# Patient Record
Sex: Female | Born: 1970
Health system: Southern US, Community
[De-identification: ages and names within clinical notes are randomized; demographics above are authoritative.]

## PROBLEM LIST (undated history)

## (undated) DIAGNOSIS — S82892A Other fracture of left lower leg, initial encounter for closed fracture: Secondary | ICD-10-CM

## (undated) DIAGNOSIS — F17200 Nicotine dependence, unspecified, uncomplicated: Secondary | ICD-10-CM

## (undated) DIAGNOSIS — F419 Anxiety disorder, unspecified: Secondary | ICD-10-CM

## (undated) DIAGNOSIS — C439 Malignant melanoma of skin, unspecified: Secondary | ICD-10-CM

## (undated) DIAGNOSIS — N6019 Diffuse cystic mastopathy of unspecified breast: Secondary | ICD-10-CM

## (undated) DIAGNOSIS — R002 Palpitations: Secondary | ICD-10-CM

## (undated) DIAGNOSIS — J301 Allergic rhinitis due to pollen: Secondary | ICD-10-CM

## (undated) DIAGNOSIS — G43909 Migraine, unspecified, not intractable, without status migrainosus: Secondary | ICD-10-CM

## (undated) DIAGNOSIS — Z8659 Personal history of other mental and behavioral disorders: Secondary | ICD-10-CM

## (undated) DIAGNOSIS — N6320 Unspecified lump in the left breast, unspecified quadrant: Secondary | ICD-10-CM

## (undated) DIAGNOSIS — D649 Anemia, unspecified: Secondary | ICD-10-CM

## (undated) DIAGNOSIS — E785 Hyperlipidemia, unspecified: Secondary | ICD-10-CM

## (undated) HISTORY — DX: Other fracture of left lower leg, initial encounter for closed fracture: S82.892A

## (undated) HISTORY — DX: Anxiety disorder, unspecified: F41.9

## (undated) HISTORY — DX: Nicotine dependence, unspecified, uncomplicated: F17.200

## (undated) HISTORY — DX: Allergic rhinitis due to pollen: J30.1

## (undated) HISTORY — DX: Palpitations: R00.2

## (undated) HISTORY — DX: Diffuse cystic mastopathy of unspecified breast: N60.19

## (undated) HISTORY — DX: Personal history of other mental and behavioral disorders: Z86.59

## (undated) HISTORY — DX: Malignant melanoma of skin, unspecified: C43.9

## (undated) HISTORY — DX: Migraine, unspecified, not intractable, without status migrainosus: G43.909

## (undated) HISTORY — DX: Hyperlipidemia, unspecified: E78.5

## (undated) HISTORY — PX: MOLE REMOVAL: SHX2046

## (undated) HISTORY — PX: BLADDER SURGERY: SHX569

## (undated) HISTORY — DX: Anemia, unspecified: D64.9

---

## 1997-10-20 ENCOUNTER — Ambulatory Visit (HOSPITAL_COMMUNITY): Admission: RE | Admit: 1997-10-20 | Discharge: 1997-10-20 | Payer: Self-pay | Admitting: *Deleted

## 1997-11-10 ENCOUNTER — Ambulatory Visit (HOSPITAL_COMMUNITY): Admission: RE | Admit: 1997-11-10 | Discharge: 1997-11-10 | Payer: Self-pay | Admitting: *Deleted

## 1997-12-17 ENCOUNTER — Ambulatory Visit (HOSPITAL_COMMUNITY): Admission: RE | Admit: 1997-12-17 | Discharge: 1997-12-17 | Payer: Self-pay | Admitting: *Deleted

## 1998-02-05 ENCOUNTER — Inpatient Hospital Stay (HOSPITAL_COMMUNITY): Admission: AD | Admit: 1998-02-05 | Discharge: 1998-02-07 | Payer: Self-pay | Admitting: *Deleted

## 1998-02-07 ENCOUNTER — Encounter (HOSPITAL_COMMUNITY): Admission: RE | Admit: 1998-02-07 | Discharge: 1998-05-08 | Payer: Self-pay | Admitting: *Deleted

## 1998-05-27 ENCOUNTER — Encounter (HOSPITAL_COMMUNITY): Admission: RE | Admit: 1998-05-27 | Discharge: 1998-08-25 | Payer: Self-pay | Admitting: *Deleted

## 1998-09-04 ENCOUNTER — Emergency Department (HOSPITAL_COMMUNITY): Admission: EM | Admit: 1998-09-04 | Discharge: 1998-09-04 | Payer: Self-pay | Admitting: Emergency Medicine

## 1998-09-04 ENCOUNTER — Encounter: Payer: Self-pay | Admitting: Emergency Medicine

## 1998-09-25 ENCOUNTER — Emergency Department (HOSPITAL_COMMUNITY): Admission: EM | Admit: 1998-09-25 | Discharge: 1998-09-25 | Payer: Self-pay | Admitting: Emergency Medicine

## 2001-01-29 ENCOUNTER — Other Ambulatory Visit: Admission: RE | Admit: 2001-01-29 | Discharge: 2001-01-29 | Payer: Self-pay | Admitting: *Deleted

## 2002-02-24 ENCOUNTER — Other Ambulatory Visit: Admission: RE | Admit: 2002-02-24 | Discharge: 2002-02-24 | Payer: Self-pay | Admitting: Obstetrics and Gynecology

## 2003-02-27 ENCOUNTER — Other Ambulatory Visit: Admission: RE | Admit: 2003-02-27 | Discharge: 2003-02-27 | Payer: Self-pay | Admitting: Obstetrics and Gynecology

## 2003-06-13 ENCOUNTER — Emergency Department (HOSPITAL_COMMUNITY): Admission: EM | Admit: 2003-06-13 | Discharge: 2003-06-13 | Payer: Self-pay | Admitting: Emergency Medicine

## 2004-10-20 ENCOUNTER — Encounter: Admission: RE | Admit: 2004-10-20 | Discharge: 2004-10-20 | Payer: Self-pay | Admitting: Cardiology

## 2004-11-07 ENCOUNTER — Encounter: Admission: RE | Admit: 2004-11-07 | Discharge: 2004-11-07 | Payer: Self-pay | Admitting: Orthopedic Surgery

## 2004-12-08 ENCOUNTER — Other Ambulatory Visit: Admission: RE | Admit: 2004-12-08 | Discharge: 2004-12-08 | Payer: Self-pay | Admitting: Obstetrics and Gynecology

## 2005-04-29 ENCOUNTER — Emergency Department (HOSPITAL_COMMUNITY): Admission: EM | Admit: 2005-04-29 | Discharge: 2005-04-30 | Payer: Self-pay | Admitting: Emergency Medicine

## 2005-05-05 ENCOUNTER — Encounter: Admission: RE | Admit: 2005-05-05 | Discharge: 2005-05-05 | Payer: Self-pay | Admitting: Gastroenterology

## 2005-05-09 ENCOUNTER — Ambulatory Visit (HOSPITAL_COMMUNITY): Admission: RE | Admit: 2005-05-09 | Discharge: 2005-05-09 | Payer: Self-pay | Admitting: Gastroenterology

## 2005-06-01 ENCOUNTER — Ambulatory Visit (HOSPITAL_COMMUNITY): Admission: RE | Admit: 2005-06-01 | Discharge: 2005-06-01 | Payer: Self-pay | Admitting: Gastroenterology

## 2005-06-03 ENCOUNTER — Emergency Department (HOSPITAL_COMMUNITY): Admission: EM | Admit: 2005-06-03 | Discharge: 2005-06-03 | Payer: Self-pay | Admitting: Emergency Medicine

## 2005-06-21 ENCOUNTER — Ambulatory Visit (HOSPITAL_COMMUNITY): Admission: RE | Admit: 2005-06-21 | Discharge: 2005-06-21 | Payer: Self-pay | Admitting: Gastroenterology

## 2006-01-16 ENCOUNTER — Other Ambulatory Visit: Admission: RE | Admit: 2006-01-16 | Discharge: 2006-01-16 | Payer: Self-pay | Admitting: Obstetrics and Gynecology

## 2010-08-27 ENCOUNTER — Encounter: Payer: Self-pay | Admitting: Gastroenterology

## 2011-12-06 ENCOUNTER — Other Ambulatory Visit: Payer: Self-pay | Admitting: Obstetrics and Gynecology

## 2011-12-06 DIAGNOSIS — Z1231 Encounter for screening mammogram for malignant neoplasm of breast: Secondary | ICD-10-CM

## 2012-02-07 ENCOUNTER — Ambulatory Visit
Admission: RE | Admit: 2012-02-07 | Discharge: 2012-02-07 | Disposition: A | Discharge status: Home / Self Care | Payer: BC Managed Care – PPO | Source: Ambulatory Visit | Attending: Obstetrics and Gynecology | Admitting: Obstetrics and Gynecology

## 2012-02-07 DIAGNOSIS — Z1231 Encounter for screening mammogram for malignant neoplasm of breast: Secondary | ICD-10-CM

## 2012-02-13 ENCOUNTER — Other Ambulatory Visit: Payer: Self-pay | Admitting: Obstetrics and Gynecology

## 2012-02-13 DIAGNOSIS — R928 Other abnormal and inconclusive findings on diagnostic imaging of breast: Secondary | ICD-10-CM

## 2012-02-28 ENCOUNTER — Ambulatory Visit
Admission: RE | Admit: 2012-02-28 | Discharge: 2012-02-28 | Disposition: A | Discharge status: Home / Self Care | Payer: BC Managed Care – PPO | Source: Ambulatory Visit | Attending: Obstetrics and Gynecology | Admitting: Obstetrics and Gynecology

## 2012-02-28 DIAGNOSIS — R928 Other abnormal and inconclusive findings on diagnostic imaging of breast: Secondary | ICD-10-CM

## 2012-11-01 ENCOUNTER — Encounter: Payer: Self-pay | Admitting: Family Medicine

## 2012-11-01 ENCOUNTER — Ambulatory Visit (INDEPENDENT_AMBULATORY_CARE_PROVIDER_SITE_OTHER): Payer: BC Managed Care – PPO | Admitting: Family Medicine

## 2012-11-01 VITALS — BP 119/73 | HR 83 | Temp 99.4°F | Resp 14 | Ht 60.5 in | Wt 95.5 lb

## 2012-11-01 DIAGNOSIS — Z0000 Encounter for general adult medical examination without abnormal findings: Secondary | ICD-10-CM | POA: Insufficient documentation
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
## 2012-11-02 ENCOUNTER — Encounter: Payer: Self-pay | Admitting: Family Medicine

## 2012-11-02 NOTE — Assessment & Plan Note (Addendum)
Reviewed age and gender appropriate health maintenance issues (prudent diet, regular exercise, health risks of tobacco and excessive alcohol, use of seatbelts, fire alarms in home, use of sunscreen).  Also reviewed age and gender appropriate health screening as well as vaccine recommendations. No vaccines due (last Tdap 2 yrs ago per her report).  Smoking cessation encouraged/discussed: pt not contemplating quitting at this time. She'll get paps/mammos via GYN. I am fine with continuing her on her xanax as currently prescribed if she wants me to be the person to RF this in the future (currently this rx is through Dr. Sharyn Lull, her cardiologist).  If not, then I'll see her again for routine CPE in 1 yr.

## 2013-02-10 ENCOUNTER — Other Ambulatory Visit: Payer: Self-pay | Admitting: Obstetrics and Gynecology

## 2013-02-10 DIAGNOSIS — Z1231 Encounter for screening mammogram for malignant neoplasm of breast: Secondary | ICD-10-CM

## 2013-02-28 ENCOUNTER — Ambulatory Visit: Payer: BC Managed Care – PPO

## 2013-03-06 ENCOUNTER — Ambulatory Visit
Admission: RE | Admit: 2013-03-06 | Discharge: 2013-03-06 | Disposition: A | Discharge status: Home / Self Care | Payer: BC Managed Care – PPO | Source: Ambulatory Visit | Attending: Obstetrics and Gynecology | Admitting: Obstetrics and Gynecology

## 2013-03-06 DIAGNOSIS — Z1231 Encounter for screening mammogram for malignant neoplasm of breast: Secondary | ICD-10-CM

## 2013-03-18 DIAGNOSIS — D229 Melanocytic nevi, unspecified: Secondary | ICD-10-CM

## 2013-03-18 HISTORY — DX: Melanocytic nevi, unspecified: D22.9

## 2013-11-04 ENCOUNTER — Telehealth: Payer: Self-pay | Admitting: Family Medicine

## 2013-11-04 ENCOUNTER — Encounter: Payer: Self-pay | Admitting: Family Medicine

## 2013-11-04 ENCOUNTER — Ambulatory Visit (INDEPENDENT_AMBULATORY_CARE_PROVIDER_SITE_OTHER): Payer: BC Managed Care – PPO | Admitting: Family Medicine

## 2013-11-04 VITALS — BP 109/72 | HR 67 | Temp 99.0°F | Resp 16 | Ht 60.5 in | Wt 110.0 lb

## 2013-11-04 DIAGNOSIS — Z0000 Encounter for general adult medical examination without abnormal findings: Secondary | ICD-10-CM

## 2013-11-04 LAB — LIPID PANEL
CHOLESTEROL: 195 mg/dL (ref 0–200)
HDL: 56.9 mg/dL (ref >39.00)
LDL CALC: 128 mg/dL — AB (ref 0–99)
TRIGLYCERIDES: 53 mg/dL (ref 0.0–149.0)
Total CHOL/HDL Ratio: 3
VLDL: 10.6 mg/dL (ref 0.0–40.0)

## 2013-11-04 LAB — COMPREHENSIVE METABOLIC PANEL
ALBUMIN: 4.2 g/dL (ref 3.5–5.2)
ALT: 13 U/L (ref 0–35)
AST: 19 U/L (ref 0–37)
Alkaline Phosphatase: 54 U/L (ref 39–117)
BUN: 8 mg/dL (ref 6–23)
CALCIUM: 8.8 mg/dL (ref 8.4–10.5)
CO2: 25 meq/L (ref 19–32)
Chloride: 105 mEq/L (ref 96–112)
Creatinine, Ser: 0.8 mg/dL (ref 0.4–1.2)
GFR: 89.89 mL/min (ref >60.00)
Glucose, Bld: 93 mg/dL (ref 70–99)
Potassium: 4 mEq/L (ref 3.5–5.1)
Sodium: 139 mEq/L (ref 135–145)
TOTAL PROTEIN: 7 g/dL (ref 6.0–8.3)
Total Bilirubin: 0.6 mg/dL (ref 0.3–1.2)

## 2013-11-04 LAB — CBC WITH DIFFERENTIAL/PLATELET
BASOS PCT: 0.7 % (ref 0.0–3.0)
Basophils Absolute: 0.1 10*3/uL (ref 0.0–0.1)
Eosinophils Absolute: 0.4 10*3/uL (ref 0.0–0.7)
Eosinophils Relative: 5.4 % — ABNORMAL HIGH (ref 0.0–5.0)
HEMATOCRIT: 40.1 % (ref 36.0–46.0)
Hemoglobin: 13.3 g/dL (ref 12.0–15.0)
LYMPHS PCT: 22.8 % (ref 12.0–46.0)
Lymphs Abs: 1.9 10*3/uL (ref 0.7–4.0)
MCHC: 33.2 g/dL (ref 30.0–36.0)
MCV: 94.2 fl (ref 78.0–100.0)
MONOS PCT: 7.4 % (ref 3.0–12.0)
Monocytes Absolute: 0.6 10*3/uL (ref 0.1–1.0)
NEUTROS ABS: 5.2 10*3/uL (ref 1.4–7.7)
NEUTROS PCT: 63.7 % (ref 43.0–77.0)
PLATELETS: 239 10*3/uL (ref 150.0–400.0)
RBC: 4.26 Mil/uL (ref 3.87–5.11)
RDW: 14.6 % (ref 11.5–14.6)
WBC: 8.1 10*3/uL (ref 4.5–10.5)

## 2013-11-04 LAB — TSH: TSH: 0.48 u[IU]/mL (ref 0.35–5.50)

## 2013-11-04 NOTE — Assessment & Plan Note (Signed)
Reviewed age and gender appropriate health maintenance issues (prudent diet, regular exercise, health risks of tobacco and excessive alcohol, use of seatbelts, fire alarms in home, use of sunscreen).  Also reviewed age and gender appropriate health screening as well as vaccine recommendations. HP labs today--will forward copy to her cardiologist, Dr. Terrence Dupont.  Encouraged smoking cessation and START EXERCISING REGULARLY. GYN health maintenance is UTD, followed by GYN MD. Vaccines UTD.

## 2013-11-04 NOTE — Telephone Encounter (Signed)
Relevant patient education mailed to patient.  

## 2013-11-04 NOTE — Progress Notes (Signed)
Pre visit review using our clinic review tool, if applicable. No additional management support is needed unless otherwise documented below in the visit note. 
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
## 2013-11-12 ENCOUNTER — Encounter: Payer: Self-pay | Admitting: Family Medicine

## 2014-05-05 ENCOUNTER — Other Ambulatory Visit: Payer: Self-pay

## 2014-05-05 DIAGNOSIS — Z1231 Encounter for screening mammogram for malignant neoplasm of breast: Secondary | ICD-10-CM

## 2014-05-26 ENCOUNTER — Ambulatory Visit
Admission: RE | Admit: 2014-05-26 | Discharge: 2014-05-26 | Disposition: A | Discharge status: Home / Self Care | Payer: BC Managed Care – PPO | Source: Ambulatory Visit

## 2014-05-26 DIAGNOSIS — Z1231 Encounter for screening mammogram for malignant neoplasm of breast: Secondary | ICD-10-CM

## 2014-05-28 ENCOUNTER — Other Ambulatory Visit: Payer: Self-pay | Admitting: Obstetrics and Gynecology

## 2014-05-28 DIAGNOSIS — R928 Other abnormal and inconclusive findings on diagnostic imaging of breast: Secondary | ICD-10-CM

## 2014-06-17 ENCOUNTER — Ambulatory Visit
Admission: RE | Admit: 2014-06-17 | Discharge: 2014-06-17 | Disposition: A | Discharge status: Home / Self Care | Payer: BC Managed Care – PPO | Source: Ambulatory Visit | Attending: Obstetrics and Gynecology | Admitting: Obstetrics and Gynecology

## 2014-06-17 DIAGNOSIS — R928 Other abnormal and inconclusive findings on diagnostic imaging of breast: Secondary | ICD-10-CM

## 2014-08-25 ENCOUNTER — Ambulatory Visit (INDEPENDENT_AMBULATORY_CARE_PROVIDER_SITE_OTHER): Payer: BLUE CROSS/BLUE SHIELD | Admitting: Nurse Practitioner

## 2014-08-25 ENCOUNTER — Encounter: Payer: Self-pay | Admitting: Nurse Practitioner

## 2014-08-25 VITALS — BP 109/69 | HR 87 | Temp 98.2°F | Ht 60.5 in | Wt 103.0 lb

## 2014-08-25 DIAGNOSIS — R8290 Unspecified abnormal findings in urine: Secondary | ICD-10-CM

## 2014-08-25 DIAGNOSIS — R8299 Other abnormal findings in urine: Secondary | ICD-10-CM

## 2014-08-25 LAB — POCT URINALYSIS DIPSTICK
Bilirubin, UA: NEGATIVE
GLUCOSE UA: NEGATIVE
Ketones, UA: NEGATIVE
Leukocytes, UA: NEGATIVE
NITRITE UA: NEGATIVE
PH UA: 6
Spec Grav, UA: 1.025
Urobilinogen, UA: 1

## 2014-08-25 MED ORDER — PHENAZOPYRIDINE HCL 200 MG PO TABS: 200.0000 mg | ORAL_TABLET | Freq: Three times a day (TID) | ORAL | Status: DC | PRN

## 2014-08-25 MED ORDER — SULFAMETHOXAZOLE-TRIMETHOPRIM 800-160 MG PO TABS: 1.0000 | ORAL_TABLET | Freq: Two times a day (BID) | ORAL | Status: DC

## 2014-08-25 NOTE — Progress Notes (Signed)
Pre visit review using our clinic review tool, if applicable. No additional management support is needed unless otherwise documented below in the visit note. 

## 2014-08-25 NOTE — Patient Instructions (Signed)
Start antibiotic. Our office will call if we need to change the antibiotic. Take pyridium to relax bladder, caution: urine tears & sweat will be orange. Do not be alarmed! Sip hydrating fluids (water, juice, colorless soda, decaff tea) every hour to flush kidneys. Return if symptoms do not improve or you feel worse.  Urinary Tract Infection Urinary tract infections (UTIs) can develop anywhere along your urinary tract. Your urinary tract is your body's drainage system for removing wastes and extra water. Your urinary tract includes two kidneys, two ureters, a bladder, and a urethra. Your kidneys are a pair of bean-shaped organs. Each kidney is about the size of your fist. They are located below your ribs, one on each side of your spine. CAUSES Infections are caused by microbes, which are microscopic organisms, including fungi, viruses, and bacteria. These organisms are so small that they can only be seen through a microscope. Bacteria are the microbes that most commonly cause UTIs. SYMPTOMS  Symptoms of UTIs may vary by age and gender of the patient and by the location of the infection. Symptoms in young women typically include a frequent and intense urge to urinate and a painful, burning feeling in the bladder or urethra during urination. Older women and men are more likely to be tired, shaky, and weak and have muscle aches and abdominal pain. A fever may mean the infection is in your kidneys. Other symptoms of a kidney infection include pain in your back or sides below the ribs, nausea, and vomiting. DIAGNOSIS To diagnose a UTI, your caregiver will ask you about your symptoms. Your caregiver also will ask to provide a urine sample. The urine sample will be tested for bacteria and white blood cells. White blood cells are made by your body to help fight infection. TREATMENT  Typically, UTIs can be treated with medication. Because most UTIs are caused by a bacterial infection, they usually can be treated  with the use of antibiotics. The choice of antibiotic and length of treatment depend on your symptoms and the type of bacteria causing your infection. HOME CARE INSTRUCTIONS  If you were prescribed antibiotics, take them exactly as your caregiver instructs you. Finish the medication even if you feel better after you have only taken some of the medication.  Drink enough water and fluids to keep your urine clear or pale yellow.  Avoid caffeine, tea, and carbonated beverages. They tend to irritate your bladder.  Empty your bladder often. Avoid holding urine for long periods of time.  Empty your bladder before and after sexual intercourse.  After a bowel movement, women should cleanse from front to back. Use each tissue only once. SEEK MEDICAL CARE IF:   You have back pain.  You develop a fever.  Your symptoms do not begin to resolve within 3 days. SEEK IMMEDIATE MEDICAL CARE IF:   You have severe back pain or lower abdominal pain.  You develop chills.  You have nausea or vomiting.  You have continued burning or discomfort with urination. MAKE SURE YOU:   Understand these instructions.  Will watch your condition.  Will get help right away if you are not doing well or get worse. Document Released: 05/03/2005 Document Revised: 01/23/2012 Document Reviewed: 09/01/2011 [facility] Patient Information 2014 [place].

## 2014-08-25 NOTE — Progress Notes (Signed)
   Subjective:    Patient ID: Sherry Skinner, female    DOB: Nov 14, 1970, 44 y.o.   MRN: [id number]  Urinary Tract Infection  This is a new problem. The current episode started in the past 7 days (3d). The problem occurs every urination. The problem has been unchanged. The quality of the pain is described as aching. The pain is mild. Associated symptoms include flank pain and frequency. Pertinent negatives include no chills, discharge, hematuria, hesitancy, nausea (replies "I'm not sure"), urgency or vomiting. She has tried nothing for the symptoms.      Review of Systems  Constitutional: Negative for fever, chills and fatigue.  Gastrointestinal: Negative for nausea (replies "I'm not sure") and vomiting.  Genitourinary: Positive for frequency and flank pain. Negative for hesitancy, urgency and hematuria.  Musculoskeletal: Positive for back pain (L lumbar pain) and arthralgias (c/o R shoulder pain-she has had PT w/out relief. Advised to f/u w/Dr McGowen.).       Objective:   Physical Exam  Constitutional: She is oriented to person, place, and time. She appears well-developed and well-nourished. No distress.  Pt holding shoulder intermittently during exam.  HENT:  Head: Normocephalic and atraumatic.  Eyes: Conjunctivae are normal. Right eye exhibits discharge. Left eye exhibits discharge.  Cardiovascular: Normal rate.   Pulmonary/Chest: Effort normal. No respiratory distress.  Abdominal: Soft. Bowel sounds are normal. She exhibits no distension and no mass. There is no tenderness. There is no rebound and no guarding.  Musculoskeletal: Tenderness: R CVA tenderness-hyper response to palpation. c/o L lumbar pain on history intake.  Neurological: She is alert and oriented to person, place, and time.  Skin: Skin is warm and dry.  Psychiatric: She has a normal mood and affect. Her behavior is normal. Thought content normal.  Vitals reviewed.         Assessment & Plan:  1. Cloudy  urine - Urine culture - POCT urinalysis dipstick-trace blood & protein - phenazopyridine (PYRIDIUM) 200 MG tablet; Take 1 tablet (200 mg total) by mouth 3 (three) times daily as needed for pain.  Dispense: 6 tablet; Refill: 0 - sulfamethoxazole-trimethoprim (BACTRIM DS,SEPTRA DS) 800-160 MG per tablet; Take 1 tablet by mouth 2 (two) times daily.  Dispense: 6 tablet; Refill: 0 See pt instructions F/u PRN

## 2014-08-26 ENCOUNTER — Telehealth: Payer: Self-pay | Admitting: Family Medicine

## 2014-08-26 NOTE — Telephone Encounter (Signed)
emmi emailed °

## 2014-08-27 ENCOUNTER — Telehealth: Payer: Self-pay | Admitting: Nurse Practitioner

## 2014-08-27 LAB — URINE CULTURE
Colony Count: NO GROWTH
ORGANISM ID, BACTERIA: NO GROWTH

## 2014-08-27 NOTE — Telephone Encounter (Signed)
Left detailed message informing pt.

## 2014-08-27 NOTE — Telephone Encounter (Signed)
LMOVM for pt to return call 

## 2014-08-27 NOTE — Telephone Encounter (Signed)
pls call pt: Advise No growth in urine culture. Ask if feeling better.

## 2014-08-27 NOTE — Telephone Encounter (Signed)
pls call pt: Advise No growth in urine culture Ask if feeling better

## 2014-08-27 NOTE — Telephone Encounter (Signed)
Yes, she can stop bactrim. Sounds like her pain may be musculoskeletal. She should try 400 mg ibuprophen twice daily with food for a few days. If no improvement, call for appt.

## 2014-08-27 NOTE — Telephone Encounter (Signed)
Patient stated that she is still having low back pain w/no other urinary symptoms. Patient also stated that since she's been taking Bactrim she has been nauseous. Patient wants to know if she can discontinue taking the antibiotic? She has 3 pills left. Please advise?

## 2014-09-07 ENCOUNTER — Ambulatory Visit (INDEPENDENT_AMBULATORY_CARE_PROVIDER_SITE_OTHER): Payer: BLUE CROSS/BLUE SHIELD

## 2014-09-07 DIAGNOSIS — Z23 Encounter for immunization: Secondary | ICD-10-CM

## 2014-11-04 ENCOUNTER — Encounter: Payer: BLUE CROSS/BLUE SHIELD | Admitting: Family Medicine

## 2014-11-05 ENCOUNTER — Encounter: Payer: BLUE CROSS/BLUE SHIELD | Admitting: Family Medicine

## 2014-11-06 ENCOUNTER — Encounter: Payer: BC Managed Care – PPO | Admitting: Family Medicine

## 2014-11-24 ENCOUNTER — Encounter: Payer: Self-pay | Admitting: Family Medicine

## 2014-11-24 ENCOUNTER — Ambulatory Visit (INDEPENDENT_AMBULATORY_CARE_PROVIDER_SITE_OTHER): Payer: BLUE CROSS/BLUE SHIELD | Admitting: Family Medicine

## 2014-11-24 VITALS — BP 104/59 | HR 78 | Temp 98.7°F | Resp 18 | Ht 60.5 in | Wt 105.0 lb

## 2014-11-24 DIAGNOSIS — Z0000 Encounter for general adult medical examination without abnormal findings: Secondary | ICD-10-CM

## 2014-11-24 LAB — COMPREHENSIVE METABOLIC PANEL
ALBUMIN: 4.4 g/dL (ref 3.5–5.2)
ALK PHOS: 64 U/L (ref 39–117)
ALT: 10 U/L (ref 0–35)
AST: 16 U/L (ref 0–37)
BUN: 7 mg/dL (ref 6–23)
CALCIUM: 9.3 mg/dL (ref 8.4–10.5)
CO2: 30 meq/L (ref 19–32)
Chloride: 104 mEq/L (ref 96–112)
Creatinine, Ser: 0.73 mg/dL (ref 0.40–1.20)
GFR: 92.27 mL/min (ref >60.00)
GLUCOSE: 92 mg/dL (ref 70–99)
Potassium: 4.7 mEq/L (ref 3.5–5.1)
Sodium: 137 mEq/L (ref 135–145)
TOTAL PROTEIN: 7 g/dL (ref 6.0–8.3)
Total Bilirubin: 0.5 mg/dL (ref 0.2–1.2)

## 2014-11-24 LAB — LIPID PANEL
CHOL/HDL RATIO: 3
Cholesterol: 218 mg/dL — ABNORMAL HIGH (ref 0–200)
HDL: 63.1 mg/dL (ref >39.00)
LDL CALC: 146 mg/dL — AB (ref 0–99)
NonHDL: 154.9
Triglycerides: 43 mg/dL (ref 0.0–149.0)
VLDL: 8.6 mg/dL (ref 0.0–40.0)

## 2014-11-24 LAB — CBC WITH DIFFERENTIAL/PLATELET
Basophils Absolute: 0.1 10*3/uL (ref 0.0–0.1)
Basophils Relative: 0.6 % (ref 0.0–3.0)
EOS ABS: 0.4 10*3/uL (ref 0.0–0.7)
Eosinophils Relative: 4.9 % (ref 0.0–5.0)
HEMATOCRIT: 41.3 % (ref 36.0–46.0)
Hemoglobin: 14.1 g/dL (ref 12.0–15.0)
LYMPHS ABS: 2.4 10*3/uL (ref 0.7–4.0)
Lymphocytes Relative: 28.5 % (ref 12.0–46.0)
MCHC: 34.2 g/dL (ref 30.0–36.0)
MCV: 91.7 fl (ref 78.0–100.0)
MONO ABS: 0.6 10*3/uL (ref 0.1–1.0)
Monocytes Relative: 7 % (ref 3.0–12.0)
NEUTROS PCT: 59 % (ref 43.0–77.0)
Neutro Abs: 5 10*3/uL (ref 1.4–7.7)
PLATELETS: 261 10*3/uL (ref 150.0–400.0)
RBC: 4.5 Mil/uL (ref 3.87–5.11)
RDW: 15.4 % (ref 11.5–15.5)
WBC: 8.5 10*3/uL (ref 4.0–10.5)

## 2014-11-24 LAB — TSH: TSH: 1.06 u[IU]/mL (ref 0.35–4.50)

## 2014-11-24 NOTE — Progress Notes (Signed)
Pre visit review using our clinic review tool, if applicable. No additional management support is needed unless otherwise documented below in the visit note. 
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
## 2015-06-21 LAB — HM MAMMOGRAPHY

## 2015-09-15 ENCOUNTER — Ambulatory Visit (INDEPENDENT_AMBULATORY_CARE_PROVIDER_SITE_OTHER): Payer: BLUE CROSS/BLUE SHIELD

## 2015-09-15 DIAGNOSIS — Z23 Encounter for immunization: Secondary | ICD-10-CM | POA: Diagnosis not present

## 2015-10-13 ENCOUNTER — Other Ambulatory Visit: Payer: BLUE CROSS/BLUE SHIELD

## 2015-10-14 ENCOUNTER — Encounter: Payer: BLUE CROSS/BLUE SHIELD | Admitting: Family Medicine

## 2015-10-20 ENCOUNTER — Encounter: Payer: BLUE CROSS/BLUE SHIELD | Admitting: Family Medicine

## 2015-11-30 ENCOUNTER — Ambulatory Visit (INDEPENDENT_AMBULATORY_CARE_PROVIDER_SITE_OTHER): Payer: BLUE CROSS/BLUE SHIELD | Admitting: Family Medicine

## 2015-11-30 ENCOUNTER — Encounter: Payer: Self-pay | Admitting: Family Medicine

## 2015-11-30 VITALS — BP 114/74 | HR 61 | Temp 98.1°F | Resp 16 | Ht 60.5 in | Wt 96.0 lb

## 2015-11-30 DIAGNOSIS — Z0000 Encounter for general adult medical examination without abnormal findings: Secondary | ICD-10-CM

## 2015-11-30 LAB — COMPREHENSIVE METABOLIC PANEL
ALBUMIN: 4.7 g/dL (ref 3.5–5.2)
ALK PHOS: 55 U/L (ref 39–117)
ALT: 10 U/L (ref 0–35)
AST: 15 U/L (ref 0–37)
BILIRUBIN TOTAL: 0.6 mg/dL (ref 0.2–1.2)
BUN: 7 mg/dL (ref 6–23)
CO2: 27 mEq/L (ref 19–32)
Calcium: 9.6 mg/dL (ref 8.4–10.5)
Chloride: 103 mEq/L (ref 96–112)
Creatinine, Ser: 0.75 mg/dL (ref 0.40–1.20)
GFR: 89.02 mL/min (ref >60.00)
Glucose, Bld: 94 mg/dL (ref 70–99)
POTASSIUM: 4.3 meq/L (ref 3.5–5.1)
Sodium: 139 mEq/L (ref 135–145)
TOTAL PROTEIN: 7.3 g/dL (ref 6.0–8.3)

## 2015-11-30 LAB — CBC WITH DIFFERENTIAL/PLATELET
BASOS PCT: 0.6 % (ref 0.0–3.0)
Basophils Absolute: 0.1 10*3/uL (ref 0.0–0.1)
EOS ABS: 0.4 10*3/uL (ref 0.0–0.7)
Eosinophils Relative: 4.7 % (ref 0.0–5.0)
HCT: 43.7 % (ref 36.0–46.0)
HEMOGLOBIN: 14.7 g/dL (ref 12.0–15.0)
LYMPHS ABS: 2.5 10*3/uL (ref 0.7–4.0)
Lymphocytes Relative: 31.1 % (ref 12.0–46.0)
MCHC: 33.7 g/dL (ref 30.0–36.0)
MCV: 92.8 fl (ref 78.0–100.0)
MONO ABS: 0.5 10*3/uL (ref 0.1–1.0)
Monocytes Relative: 6.6 % (ref 3.0–12.0)
NEUTROS ABS: 4.5 10*3/uL (ref 1.4–7.7)
NEUTROS PCT: 57 % (ref 43.0–77.0)
PLATELETS: 248 10*3/uL (ref 150.0–400.0)
RBC: 4.71 Mil/uL (ref 3.87–5.11)
RDW: 14.7 % (ref 11.5–15.5)
WBC: 7.9 10*3/uL (ref 4.0–10.5)

## 2015-11-30 LAB — LIPID PANEL
CHOLESTEROL: 209 mg/dL — AB (ref 0–200)
HDL: 63.3 mg/dL (ref >39.00)
LDL Cholesterol: 133 mg/dL — ABNORMAL HIGH (ref 0–99)
NONHDL: 145.29
Total CHOL/HDL Ratio: 3
Triglycerides: 59 mg/dL (ref 0.0–149.0)
VLDL: 11.8 mg/dL (ref 0.0–40.0)

## 2015-11-30 LAB — TSH: TSH: 1.27 u[IU]/mL (ref 0.35–4.50)
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
## 2015-11-30 NOTE — Progress Notes (Signed)
Pre visit review using our clinic review tool, if applicable. No additional management support is needed unless otherwise documented below in the visit note. 

## 2016-01-27 DIAGNOSIS — D485 Neoplasm of uncertain behavior of skin: Secondary | ICD-10-CM | POA: Diagnosis not present

## 2016-01-27 DIAGNOSIS — D239 Other benign neoplasm of skin, unspecified: Secondary | ICD-10-CM | POA: Diagnosis not present

## 2016-02-10 DIAGNOSIS — F17290 Nicotine dependence, other tobacco product, uncomplicated: Secondary | ICD-10-CM | POA: Diagnosis not present

## 2016-02-10 DIAGNOSIS — Z716 Tobacco abuse counseling: Secondary | ICD-10-CM | POA: Diagnosis not present

## 2016-02-10 DIAGNOSIS — Z720 Tobacco use: Secondary | ICD-10-CM | POA: Diagnosis not present

## 2016-02-10 DIAGNOSIS — E785 Hyperlipidemia, unspecified: Secondary | ICD-10-CM | POA: Diagnosis not present

## 2016-04-12 DIAGNOSIS — H40219 Acute angle-closure glaucoma, unspecified eye: Secondary | ICD-10-CM | POA: Diagnosis not present

## 2016-09-13 ENCOUNTER — Ambulatory Visit (INDEPENDENT_AMBULATORY_CARE_PROVIDER_SITE_OTHER): Payer: BLUE CROSS/BLUE SHIELD | Admitting: Family Medicine

## 2016-09-13 ENCOUNTER — Encounter: Payer: Self-pay | Admitting: Family Medicine

## 2016-09-13 VITALS — BP 104/64 | HR 72 | Temp 98.3°F | Resp 16 | Ht 60.0 in | Wt 87.5 lb

## 2016-09-13 DIAGNOSIS — Z23 Encounter for immunization: Secondary | ICD-10-CM | POA: Diagnosis not present

## 2016-09-13 DIAGNOSIS — Z0000 Encounter for general adult medical examination without abnormal findings: Secondary | ICD-10-CM | POA: Diagnosis not present

## 2016-09-13 LAB — CBC WITH DIFFERENTIAL/PLATELET
Basophils Absolute: 0.1 10*3/uL (ref 0.0–0.1)
Basophils Relative: 0.9 % (ref 0.0–3.0)
EOS PCT: 4.8 % (ref 0.0–5.0)
Eosinophils Absolute: 0.4 10*3/uL (ref 0.0–0.7)
HCT: 39.8 % (ref 36.0–46.0)
Hemoglobin: 13.2 g/dL (ref 12.0–15.0)
LYMPHS ABS: 1.9 10*3/uL (ref 0.7–4.0)
Lymphocytes Relative: 20.9 % (ref 12.0–46.0)
MCHC: 33.2 g/dL (ref 30.0–36.0)
MCV: 96 fl (ref 78.0–100.0)
MONOS PCT: 7 % (ref 3.0–12.0)
Monocytes Absolute: 0.6 10*3/uL (ref 0.1–1.0)
NEUTROS ABS: 6 10*3/uL (ref 1.4–7.7)
NEUTROS PCT: 66.4 % (ref 43.0–77.0)
PLATELETS: 259 10*3/uL (ref 150.0–400.0)
RBC: 4.15 Mil/uL (ref 3.87–5.11)
RDW: 14.5 % (ref 11.5–15.5)
WBC: 9.1 10*3/uL (ref 4.0–10.5)

## 2016-09-13 LAB — COMPREHENSIVE METABOLIC PANEL
ALT: 8 U/L (ref 0–35)
AST: 14 U/L (ref 0–37)
Albumin: 4.2 g/dL (ref 3.5–5.2)
Alkaline Phosphatase: 58 U/L (ref 39–117)
BILIRUBIN TOTAL: 0.4 mg/dL (ref 0.2–1.2)
BUN: 6 mg/dL (ref 6–23)
CHLORIDE: 107 meq/L (ref 96–112)
CO2: 30 meq/L (ref 19–32)
Calcium: 9 mg/dL (ref 8.4–10.5)
Creatinine, Ser: 0.73 mg/dL (ref 0.40–1.20)
GFR: 91.51 mL/min (ref >60.00)
GLUCOSE: 98 mg/dL (ref 70–99)
Potassium: 4.4 mEq/L (ref 3.5–5.1)
Sodium: 139 mEq/L (ref 135–145)
Total Protein: 6.6 g/dL (ref 6.0–8.3)

## 2016-09-13 LAB — LIPID PANEL
CHOL/HDL RATIO: 3
Cholesterol: 175 mg/dL (ref 0–200)
HDL: 63.7 mg/dL (ref >39.00)
LDL Cholesterol: 101 mg/dL — ABNORMAL HIGH (ref 0–99)
NONHDL: 110.89
Triglycerides: 51 mg/dL (ref 0.0–149.0)
VLDL: 10.2 mg/dL (ref 0.0–40.0)

## 2016-09-13 LAB — TSH: TSH: 1.94 u[IU]/mL (ref 0.35–4.50)
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
## 2016-09-13 NOTE — Progress Notes (Signed)
Pre visit review using our clinic review tool, if applicable. No additional management support is needed unless otherwise documented below in the visit note. 

## 2016-09-13 NOTE — Addendum Note (Signed)
Addended by: Onalee Hua on: 09/13/2016 08:53 AM   Modules accepted: Orders

## 2016-09-14 ENCOUNTER — Encounter: Payer: Self-pay | Admitting: Family Medicine

## 2016-09-15 DIAGNOSIS — E785 Hyperlipidemia, unspecified: Secondary | ICD-10-CM | POA: Diagnosis not present

## 2016-09-15 DIAGNOSIS — Z720 Tobacco use: Secondary | ICD-10-CM | POA: Diagnosis not present

## 2016-09-15 DIAGNOSIS — Z716 Tobacco abuse counseling: Secondary | ICD-10-CM | POA: Diagnosis not present

## 2016-09-15 DIAGNOSIS — F17290 Nicotine dependence, other tobacco product, uncomplicated: Secondary | ICD-10-CM | POA: Diagnosis not present

## 2016-09-21 ENCOUNTER — Telehealth: Payer: Self-pay | Admitting: Family Medicine

## 2016-09-21 NOTE — Telephone Encounter (Signed)
Sherry Skinner left a detailed message on pts phone on 09/14/16.   Pt advised of lab results and voiced understanding.

## 2016-09-21 NOTE — Telephone Encounter (Signed)
Patient awaiting call from [facility] about lab results. Please call back at [phone].   Thank you

## 2016-10-03 DIAGNOSIS — D492 Neoplasm of unspecified behavior of bone, soft tissue, and skin: Secondary | ICD-10-CM | POA: Diagnosis not present

## 2016-10-03 DIAGNOSIS — D229 Melanocytic nevi, unspecified: Secondary | ICD-10-CM | POA: Diagnosis not present

## 2016-10-03 DIAGNOSIS — Z85828 Personal history of other malignant neoplasm of skin: Secondary | ICD-10-CM | POA: Diagnosis not present

## 2016-10-26 DIAGNOSIS — D485 Neoplasm of uncertain behavior of skin: Secondary | ICD-10-CM | POA: Diagnosis not present

## 2016-11-30 ENCOUNTER — Encounter: Payer: BLUE CROSS/BLUE SHIELD | Admitting: Family Medicine

## 2017-01-05 HISTORY — PX: BREAST BIOPSY: SHX20

## 2017-01-12 DIAGNOSIS — F17290 Nicotine dependence, other tobacco product, uncomplicated: Secondary | ICD-10-CM | POA: Diagnosis not present

## 2017-01-12 DIAGNOSIS — F419 Anxiety disorder, unspecified: Secondary | ICD-10-CM | POA: Diagnosis not present

## 2017-01-12 DIAGNOSIS — E785 Hyperlipidemia, unspecified: Secondary | ICD-10-CM | POA: Diagnosis not present

## 2017-01-16 ENCOUNTER — Encounter: Payer: Self-pay | Admitting: Family Medicine

## 2017-01-16 DIAGNOSIS — N83209 Unspecified ovarian cyst, unspecified side: Secondary | ICD-10-CM | POA: Diagnosis not present

## 2017-01-16 DIAGNOSIS — Z01411 Encounter for gynecological examination (general) (routine) with abnormal findings: Secondary | ICD-10-CM | POA: Diagnosis not present

## 2017-01-16 DIAGNOSIS — Z6820 Body mass index (BMI) 20.0-20.9, adult: Secondary | ICD-10-CM | POA: Diagnosis not present

## 2017-01-16 DIAGNOSIS — N6320 Unspecified lump in the left breast, unspecified quadrant: Secondary | ICD-10-CM

## 2017-01-16 DIAGNOSIS — Z124 Encounter for screening for malignant neoplasm of cervix: Secondary | ICD-10-CM | POA: Diagnosis not present

## 2017-01-16 DIAGNOSIS — Z1231 Encounter for screening mammogram for malignant neoplasm of breast: Secondary | ICD-10-CM | POA: Diagnosis not present

## 2017-01-16 HISTORY — DX: Unspecified lump in the left breast, unspecified quadrant: N63.20

## 2017-01-16 LAB — HM PAP SMEAR: HM Pap smear: NEGATIVE

## 2017-01-17 ENCOUNTER — Other Ambulatory Visit: Payer: Self-pay | Admitting: Obstetrics & Gynecology

## 2017-01-17 DIAGNOSIS — N63 Unspecified lump in unspecified breast: Secondary | ICD-10-CM

## 2017-01-18 ENCOUNTER — Other Ambulatory Visit: Payer: Self-pay | Admitting: Obstetrics & Gynecology

## 2017-01-18 ENCOUNTER — Encounter: Payer: Self-pay | Admitting: Family Medicine

## 2017-01-18 ENCOUNTER — Ambulatory Visit
Admission: RE | Admit: 2017-01-18 | Discharge: 2017-01-18 | Disposition: A | Discharge status: Home / Self Care | Payer: BLUE CROSS/BLUE SHIELD | Source: Ambulatory Visit | Attending: Obstetrics & Gynecology | Admitting: Obstetrics & Gynecology

## 2017-01-18 DIAGNOSIS — N6321 Unspecified lump in the left breast, upper outer quadrant: Secondary | ICD-10-CM | POA: Diagnosis not present

## 2017-01-18 DIAGNOSIS — N6002 Solitary cyst of left breast: Secondary | ICD-10-CM

## 2017-01-18 DIAGNOSIS — R921 Mammographic calcification found on diagnostic imaging of breast: Secondary | ICD-10-CM

## 2017-01-18 DIAGNOSIS — N6320 Unspecified lump in the left breast, unspecified quadrant: Secondary | ICD-10-CM

## 2017-01-18 DIAGNOSIS — N63 Unspecified lump in unspecified breast: Secondary | ICD-10-CM

## 2017-01-18 HISTORY — DX: Unspecified lump in the left breast, unspecified quadrant: N63.20

## 2017-01-22 DIAGNOSIS — N83202 Unspecified ovarian cyst, left side: Secondary | ICD-10-CM | POA: Diagnosis not present

## 2017-01-22 DIAGNOSIS — N644 Mastodynia: Secondary | ICD-10-CM | POA: Insufficient documentation

## 2017-01-22 DIAGNOSIS — N83209 Unspecified ovarian cyst, unspecified side: Secondary | ICD-10-CM | POA: Diagnosis not present

## 2017-01-26 ENCOUNTER — Ambulatory Visit
Admission: RE | Admit: 2017-01-26 | Discharge: 2017-01-26 | Disposition: A | Discharge status: Home / Self Care | Payer: BLUE CROSS/BLUE SHIELD | Source: Ambulatory Visit | Attending: Obstetrics & Gynecology | Admitting: Obstetrics & Gynecology

## 2017-01-26 ENCOUNTER — Other Ambulatory Visit: Payer: Self-pay | Admitting: Obstetrics & Gynecology

## 2017-01-26 DIAGNOSIS — D229 Melanocytic nevi, unspecified: Secondary | ICD-10-CM | POA: Diagnosis not present

## 2017-01-26 DIAGNOSIS — N6320 Unspecified lump in the left breast, unspecified quadrant: Secondary | ICD-10-CM

## 2017-01-26 DIAGNOSIS — N6002 Solitary cyst of left breast: Secondary | ICD-10-CM

## 2017-01-26 DIAGNOSIS — R921 Mammographic calcification found on diagnostic imaging of breast: Secondary | ICD-10-CM

## 2017-01-26 DIAGNOSIS — N6012 Diffuse cystic mastopathy of left breast: Secondary | ICD-10-CM | POA: Diagnosis not present

## 2017-01-26 DIAGNOSIS — N6321 Unspecified lump in the left breast, upper outer quadrant: Secondary | ICD-10-CM | POA: Diagnosis not present

## 2017-01-26 DIAGNOSIS — N6011 Diffuse cystic mastopathy of right breast: Secondary | ICD-10-CM | POA: Diagnosis not present

## 2017-04-05 ENCOUNTER — Other Ambulatory Visit: Payer: Self-pay | Admitting: Obstetrics and Gynecology

## 2017-04-05 DIAGNOSIS — N63 Unspecified lump in unspecified breast: Secondary | ICD-10-CM

## 2017-04-05 DIAGNOSIS — N6320 Unspecified lump in the left breast, unspecified quadrant: Secondary | ICD-10-CM | POA: Diagnosis not present

## 2017-04-11 ENCOUNTER — Ambulatory Visit
Admission: RE | Admit: 2017-04-11 | Discharge: 2017-04-11 | Disposition: A | Discharge status: Home / Self Care | Payer: BLUE CROSS/BLUE SHIELD | Source: Ambulatory Visit | Attending: Obstetrics and Gynecology | Admitting: Obstetrics and Gynecology

## 2017-04-11 ENCOUNTER — Other Ambulatory Visit: Payer: Self-pay | Admitting: Obstetrics and Gynecology

## 2017-04-11 DIAGNOSIS — N63 Unspecified lump in unspecified breast: Secondary | ICD-10-CM

## 2017-04-11 DIAGNOSIS — N6321 Unspecified lump in the left breast, upper outer quadrant: Secondary | ICD-10-CM | POA: Diagnosis not present

## 2017-04-11 DIAGNOSIS — N6002 Solitary cyst of left breast: Secondary | ICD-10-CM

## 2017-04-11 DIAGNOSIS — R922 Inconclusive mammogram: Secondary | ICD-10-CM | POA: Diagnosis not present

## 2017-04-11 LAB — HM MAMMOGRAPHY

## 2017-04-12 ENCOUNTER — Ambulatory Visit
Admission: RE | Admit: 2017-04-12 | Discharge: 2017-04-12 | Disposition: A | Discharge status: Home / Self Care | Payer: BLUE CROSS/BLUE SHIELD | Source: Ambulatory Visit | Attending: Obstetrics and Gynecology | Admitting: Obstetrics and Gynecology

## 2017-04-12 DIAGNOSIS — N6002 Solitary cyst of left breast: Secondary | ICD-10-CM

## 2017-05-21 DIAGNOSIS — F17290 Nicotine dependence, other tobacco product, uncomplicated: Secondary | ICD-10-CM | POA: Diagnosis not present

## 2017-05-21 DIAGNOSIS — Z716 Tobacco abuse counseling: Secondary | ICD-10-CM | POA: Diagnosis not present

## 2017-05-21 DIAGNOSIS — R062 Wheezing: Secondary | ICD-10-CM | POA: Diagnosis not present

## 2017-05-21 DIAGNOSIS — Z720 Tobacco use: Secondary | ICD-10-CM | POA: Diagnosis not present

## 2017-08-24 DIAGNOSIS — D229 Melanocytic nevi, unspecified: Secondary | ICD-10-CM | POA: Diagnosis not present

## 2017-09-14 ENCOUNTER — Ambulatory Visit (INDEPENDENT_AMBULATORY_CARE_PROVIDER_SITE_OTHER): Payer: BLUE CROSS/BLUE SHIELD | Admitting: Family Medicine

## 2017-09-14 ENCOUNTER — Encounter: Payer: Self-pay | Admitting: Family Medicine

## 2017-09-14 VITALS — BP 89/55 | HR 72 | Temp 98.4°F | Resp 16 | Ht 60.0 in | Wt 92.8 lb

## 2017-09-14 DIAGNOSIS — R102 Pelvic and perineal pain: Secondary | ICD-10-CM | POA: Insufficient documentation

## 2017-09-14 DIAGNOSIS — Z23 Encounter for immunization: Secondary | ICD-10-CM | POA: Diagnosis not present

## 2017-09-14 DIAGNOSIS — N83209 Unspecified ovarian cyst, unspecified side: Secondary | ICD-10-CM | POA: Insufficient documentation

## 2017-09-14 DIAGNOSIS — Z0000 Encounter for general adult medical examination without abnormal findings: Secondary | ICD-10-CM

## 2017-09-14 DIAGNOSIS — C4490 Unspecified malignant neoplasm of skin, unspecified: Secondary | ICD-10-CM | POA: Insufficient documentation

## 2017-09-14 LAB — LIPID PANEL
CHOL/HDL RATIO: 3
Cholesterol: 173 mg/dL (ref 0–200)
HDL: 54.3 mg/dL (ref >39.00)
LDL CALC: 108 mg/dL — AB (ref 0–99)
NONHDL: 118.39
Triglycerides: 52 mg/dL (ref 0.0–149.0)
VLDL: 10.4 mg/dL (ref 0.0–40.0)

## 2017-09-14 LAB — COMPREHENSIVE METABOLIC PANEL
ALT: 8 U/L (ref 0–35)
AST: 11 U/L (ref 0–37)
Albumin: 4.1 g/dL (ref 3.5–5.2)
Alkaline Phosphatase: 56 U/L (ref 39–117)
BILIRUBIN TOTAL: 0.5 mg/dL (ref 0.2–1.2)
BUN: 7 mg/dL (ref 6–23)
CO2: 31 meq/L (ref 19–32)
CREATININE: 0.69 mg/dL (ref 0.40–1.20)
Calcium: 8.8 mg/dL (ref 8.4–10.5)
Chloride: 104 mEq/L (ref 96–112)
GFR: 97.23 mL/min (ref >60.00)
GLUCOSE: 95 mg/dL (ref 70–99)
Potassium: 3.9 mEq/L (ref 3.5–5.1)
Sodium: 138 mEq/L (ref 135–145)
Total Protein: 6.6 g/dL (ref 6.0–8.3)

## 2017-09-14 LAB — TSH: TSH: 1.81 u[IU]/mL (ref 0.35–4.50)
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
## 2017-09-14 NOTE — Patient Instructions (Signed)

## 2017-09-14 NOTE — Addendum Note (Signed)
Addended by: Ralph Dowdy on: 09/14/2017 11:41 AM   Modules accepted: Orders

## 2017-09-14 NOTE — Addendum Note (Signed)
Addended by: Onalee Hua on: 09/14/2017 08:51 AM   Modules accepted: Orders

## 2017-09-15 LAB — CBC WITH DIFFERENTIAL/PLATELET
BASOS ABS: 48 {cells}/uL (ref 0–200)
BASOS PCT: 0.7 %
EOS ABS: 340 {cells}/uL (ref 15–500)
Eosinophils Relative: 5 %
HCT: 37.3 % (ref 35.0–45.0)
Hemoglobin: 13 g/dL (ref 11.7–15.5)
Lymphs Abs: 1720 cells/uL (ref 850–3900)
MCH: 31.9 pg (ref 27.0–33.0)
MCHC: 34.9 g/dL (ref 32.0–36.0)
MCV: 91.4 fL (ref 80.0–100.0)
MONOS PCT: 8.1 %
MPV: 10.1 fL (ref 7.5–12.5)
Neutro Abs: 4141 cells/uL (ref 1500–7800)
Neutrophils Relative %: 60.9 %
Platelets: 252 10*3/uL (ref 140–400)
RBC: 4.08 10*6/uL (ref 3.80–5.10)
RDW: 13.3 % (ref 11.0–15.0)
Total Lymphocyte: 25.3 %
WBC: 6.8 10*3/uL (ref 3.8–10.8)
WBCMIX: 551 {cells}/uL (ref 200–950)

## 2017-09-24 DIAGNOSIS — E785 Hyperlipidemia, unspecified: Secondary | ICD-10-CM | POA: Diagnosis not present

## 2017-09-24 DIAGNOSIS — F419 Anxiety disorder, unspecified: Secondary | ICD-10-CM | POA: Diagnosis not present

## 2017-09-24 DIAGNOSIS — F17290 Nicotine dependence, other tobacco product, uncomplicated: Secondary | ICD-10-CM | POA: Diagnosis not present

## 2018-01-05 DIAGNOSIS — M25552 Pain in left hip: Secondary | ICD-10-CM | POA: Diagnosis not present

## 2018-01-07 ENCOUNTER — Ambulatory Visit: Payer: BLUE CROSS/BLUE SHIELD | Admitting: Family Medicine

## 2018-01-07 ENCOUNTER — Other Ambulatory Visit: Payer: Self-pay | Admitting: Family Medicine

## 2018-01-07 ENCOUNTER — Telehealth: Payer: Self-pay | Admitting: *Deleted

## 2018-01-07 ENCOUNTER — Encounter: Payer: Self-pay | Admitting: Family Medicine

## 2018-01-07 VITALS — BP 94/57 | HR 74 | Temp 99.1°F | Resp 16 | Ht 60.0 in | Wt 96.4 lb

## 2018-01-07 DIAGNOSIS — M7062 Trochanteric bursitis, left hip: Secondary | ICD-10-CM

## 2018-01-07 MED ORDER — PREDNISOLONE SODIUM PHOSPHATE 30 MG PO TBDP: ORAL_TABLET | ORAL | 0 refills | Status: DC

## 2018-01-07 NOTE — Telephone Encounter (Signed)
Copied from [place] [id number]. Topic: General - Other >> Jan 07, 2018  2:33 PM Lennox Solders wrote: Reason for CRM: pt was seen today by dr Ernestine Conrad and needs a work note fax to her job attn brandi [phone]. Pt was also given a work note. Pt works in [employer] >> Jan 07, 2018  2:36 PM Lennox Solders wrote: Pt will be out of work until 01-14-18

## 2018-01-07 NOTE — Telephone Encounter (Signed)
Pharmacy is asking if it is okay to change to liquid because no one has the tablets in stock. Please advise. Thanks.
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
## 2018-01-07 NOTE — Telephone Encounter (Signed)
SW pt, she stated that the letter needs to be faxed to [place] (not [place]). Letter faxed to number provided below. Pt advised and voiced understanding.

## 2018-01-22 DIAGNOSIS — E785 Hyperlipidemia, unspecified: Secondary | ICD-10-CM | POA: Diagnosis not present

## 2018-01-22 DIAGNOSIS — F419 Anxiety disorder, unspecified: Secondary | ICD-10-CM | POA: Diagnosis not present

## 2018-01-22 DIAGNOSIS — F17290 Nicotine dependence, other tobacco product, uncomplicated: Secondary | ICD-10-CM | POA: Diagnosis not present

## 2018-01-29 ENCOUNTER — Encounter: Payer: Self-pay | Admitting: Family Medicine

## 2018-01-29 DIAGNOSIS — Z113 Encounter for screening for infections with a predominantly sexual mode of transmission: Secondary | ICD-10-CM | POA: Diagnosis not present

## 2018-01-29 DIAGNOSIS — Z681 Body mass index (BMI) 19 or less, adult: Secondary | ICD-10-CM | POA: Diagnosis not present

## 2018-01-29 DIAGNOSIS — Z01411 Encounter for gynecological examination (general) (routine) with abnormal findings: Secondary | ICD-10-CM | POA: Diagnosis not present

## 2018-01-29 DIAGNOSIS — Z1231 Encounter for screening mammogram for malignant neoplasm of breast: Secondary | ICD-10-CM | POA: Diagnosis not present

## 2018-02-01 ENCOUNTER — Encounter: Payer: Self-pay | Admitting: Family Medicine

## 2018-02-03 ENCOUNTER — Encounter: Payer: Self-pay | Admitting: Family Medicine

## 2018-02-05 DIAGNOSIS — N63 Unspecified lump in unspecified breast: Secondary | ICD-10-CM | POA: Diagnosis not present

## 2018-02-05 DIAGNOSIS — N6002 Solitary cyst of left breast: Secondary | ICD-10-CM | POA: Diagnosis not present

## 2018-02-25 ENCOUNTER — Encounter: Payer: Self-pay | Admitting: Family Medicine

## 2018-05-23 DIAGNOSIS — F419 Anxiety disorder, unspecified: Secondary | ICD-10-CM | POA: Diagnosis not present

## 2018-05-23 DIAGNOSIS — E785 Hyperlipidemia, unspecified: Secondary | ICD-10-CM | POA: Diagnosis not present

## 2018-05-23 DIAGNOSIS — I10 Essential (primary) hypertension: Secondary | ICD-10-CM | POA: Diagnosis not present

## 2018-07-19 ENCOUNTER — Ambulatory Visit: Payer: BLUE CROSS/BLUE SHIELD | Admitting: Family Medicine

## 2018-07-19 ENCOUNTER — Telehealth: Payer: Self-pay | Admitting: Family Medicine

## 2018-07-19 ENCOUNTER — Encounter: Payer: Self-pay | Admitting: Family Medicine

## 2018-07-19 VITALS — BP 99/56 | HR 79 | Temp 98.4°F | Resp 16 | Ht 60.0 in | Wt 103.2 lb

## 2018-07-19 DIAGNOSIS — M25511 Pain in right shoulder: Secondary | ICD-10-CM

## 2018-07-19 DIAGNOSIS — M542 Cervicalgia: Secondary | ICD-10-CM | POA: Diagnosis not present

## 2018-07-19 DIAGNOSIS — Z23 Encounter for immunization: Secondary | ICD-10-CM

## 2018-07-19 DIAGNOSIS — M79621 Pain in right upper arm: Secondary | ICD-10-CM

## 2018-07-19 MED ORDER — TRAMADOL HCL 50 MG PO TABS: ORAL_TABLET | ORAL | 0 refills | Status: DC
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
## 2018-07-19 NOTE — Patient Instructions (Signed)
Take THREE over the counter ibuprofen tabs WITH FOOD twice a day for 5 days.  Ice the areas of pain for 20 minutes twice a day.  Do gentle range of motion exercises/stretches of neck, right shoulder, and right elbow several times a day.
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
## 2018-07-19 NOTE — Telephone Encounter (Signed)
Noted  

## 2018-07-24 ENCOUNTER — Ambulatory Visit: Payer: BLUE CROSS/BLUE SHIELD | Admitting: Family Medicine

## 2018-07-24 ENCOUNTER — Encounter: Payer: Self-pay | Admitting: Family Medicine

## 2018-07-24 VITALS — BP 111/73 | HR 71 | Temp 98.4°F | Resp 16 | Ht 60.0 in | Wt 103.0 lb

## 2018-07-24 DIAGNOSIS — M25511 Pain in right shoulder: Secondary | ICD-10-CM

## 2018-07-24 DIAGNOSIS — M542 Cervicalgia: Secondary | ICD-10-CM

## 2018-07-24 DIAGNOSIS — M79601 Pain in right arm: Secondary | ICD-10-CM
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
## 2018-08-12 ENCOUNTER — Telehealth: Payer: Self-pay | Admitting: Family Medicine

## 2018-08-12 DIAGNOSIS — M542 Cervicalgia: Secondary | ICD-10-CM | POA: Diagnosis not present

## 2018-08-12 DIAGNOSIS — M25511 Pain in right shoulder: Secondary | ICD-10-CM | POA: Diagnosis not present

## 2018-08-12 NOTE — Telephone Encounter (Signed)
FLMA form placed on Dr. Idelle Leech desk for review.
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
## 2018-08-12 NOTE — Telephone Encounter (Signed)
Pt dropped off FMLA forms to be completed per last OV, placed in folder upfront.

## 2018-08-14 ENCOUNTER — Ambulatory Visit: Payer: Self-pay | Admitting: Family Medicine

## 2018-08-15 DIAGNOSIS — M542 Cervicalgia: Secondary | ICD-10-CM | POA: Diagnosis not present

## 2018-08-15 DIAGNOSIS — M25511 Pain in right shoulder: Secondary | ICD-10-CM | POA: Diagnosis not present

## 2018-08-16 ENCOUNTER — Encounter: Payer: Self-pay | Admitting: Family Medicine

## 2018-08-16 ENCOUNTER — Ambulatory Visit: Payer: BLUE CROSS/BLUE SHIELD | Admitting: Family Medicine

## 2018-08-16 ENCOUNTER — Encounter: Payer: Self-pay | Admitting: *Deleted

## 2018-08-16 VITALS — BP 104/64 | HR 76 | Temp 98.4°F | Resp 16 | Ht 60.0 in | Wt 98.2 lb

## 2018-08-16 DIAGNOSIS — T148XXA Other injury of unspecified body region, initial encounter: Secondary | ICD-10-CM

## 2018-08-16 DIAGNOSIS — M25511 Pain in right shoulder: Secondary | ICD-10-CM | POA: Diagnosis not present

## 2018-08-16 DIAGNOSIS — M542 Cervicalgia: Secondary | ICD-10-CM | POA: Diagnosis not present
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
## 2018-08-19 DIAGNOSIS — M542 Cervicalgia: Secondary | ICD-10-CM | POA: Diagnosis not present

## 2018-08-19 DIAGNOSIS — M25511 Pain in right shoulder: Secondary | ICD-10-CM | POA: Diagnosis not present

## 2018-08-20 DIAGNOSIS — Z0279 Encounter for issue of other medical certificate: Secondary | ICD-10-CM

## 2018-08-26 ENCOUNTER — Ambulatory Visit: Payer: Self-pay | Admitting: Family Medicine

## 2018-08-28 NOTE — Telephone Encounter (Signed)
FMLA paperwork was completed and signed by Dr. Anitra Lauth on 08/20/18.  I left pt 3 different vm's (on 08/21/18, 08/23/18, and 08/26/18) asking her to return my call to see how she would like [place] to submit her FMLA paperwork, also to let her know about the $20 fee. Pt has not returned any of my calls.   I have made a copy of pts FMLA and sent it to scan. I will mail the original to pt (address in EMR).

## 2018-10-09 DIAGNOSIS — N644 Mastodynia: Secondary | ICD-10-CM | POA: Diagnosis not present

## 2018-10-10 ENCOUNTER — Other Ambulatory Visit: Payer: Self-pay | Admitting: Obstetrics & Gynecology

## 2018-10-10 DIAGNOSIS — N644 Mastodynia: Secondary | ICD-10-CM

## 2018-10-16 ENCOUNTER — Ambulatory Visit
Admission: RE | Admit: 2018-10-16 | Discharge: 2018-10-16 | Disposition: A | Discharge status: Home / Self Care | Payer: BLUE CROSS/BLUE SHIELD | Source: Ambulatory Visit | Attending: Obstetrics & Gynecology | Admitting: Obstetrics & Gynecology

## 2018-10-16 ENCOUNTER — Other Ambulatory Visit: Payer: Self-pay

## 2018-10-16 ENCOUNTER — Other Ambulatory Visit: Payer: Self-pay | Admitting: Obstetrics & Gynecology

## 2018-10-16 DIAGNOSIS — N644 Mastodynia: Secondary | ICD-10-CM

## 2018-10-16 DIAGNOSIS — R921 Mammographic calcification found on diagnostic imaging of breast: Secondary | ICD-10-CM | POA: Diagnosis not present

## 2018-10-16 DIAGNOSIS — N6012 Diffuse cystic mastopathy of left breast: Secondary | ICD-10-CM | POA: Diagnosis not present

## 2018-10-17 ENCOUNTER — Ambulatory Visit
Admission: RE | Admit: 2018-10-17 | Discharge: 2018-10-17 | Disposition: A | Discharge status: Home / Self Care | Payer: BLUE CROSS/BLUE SHIELD | Source: Ambulatory Visit | Attending: Obstetrics & Gynecology | Admitting: Obstetrics & Gynecology

## 2018-10-17 ENCOUNTER — Other Ambulatory Visit: Payer: Self-pay

## 2018-10-17 DIAGNOSIS — N644 Mastodynia: Secondary | ICD-10-CM

## 2018-10-17 DIAGNOSIS — N6002 Solitary cyst of left breast: Secondary | ICD-10-CM | POA: Diagnosis not present

## 2018-10-17 HISTORY — PX: BREAST CYST ASPIRATION: SHX578

## 2018-10-20 ENCOUNTER — Encounter: Payer: Self-pay | Admitting: Family Medicine

## 2019-01-08 DIAGNOSIS — M791 Myalgia, unspecified site: Secondary | ICD-10-CM | POA: Diagnosis not present

## 2019-01-08 DIAGNOSIS — F419 Anxiety disorder, unspecified: Secondary | ICD-10-CM | POA: Diagnosis not present

## 2019-01-08 DIAGNOSIS — E785 Hyperlipidemia, unspecified: Secondary | ICD-10-CM | POA: Diagnosis not present

## 2019-01-08 DIAGNOSIS — I10 Essential (primary) hypertension: Secondary | ICD-10-CM | POA: Diagnosis not present

## 2019-04-26 IMAGING — [facility] DIGITAL DIAGNOSTIC UNILATERAL LEFT MAMMOGRAM WITH TOMO AND CAD
8 series · 8 of 20 positions shown · non-contrast
Comparison: Previous exam(s).

CLINICAL DATA: Area of pain and palpable concern in the left breast
upper outer quadrant. The patient has a history of cysts, as well as
benign core needle biopsies of each breast, demonstrating
fibrocystic changes with calcifications and PASH.

EXAM:
DIGITAL DIAGNOSTIC LEFT MAMMOGRAM WITH CAD AND TOMO
ULTRASOUND LEFT BREAST

[L ML]
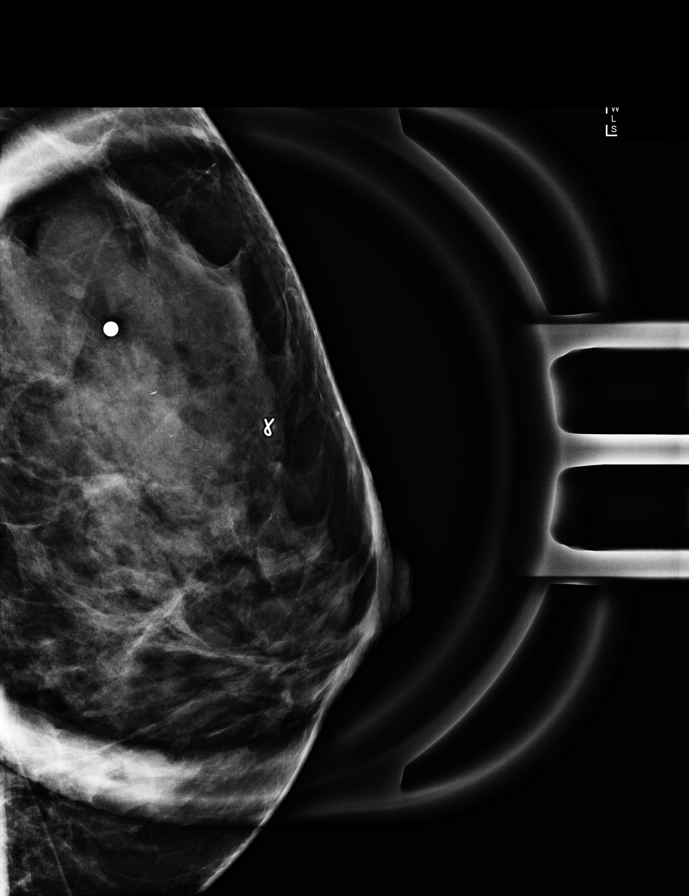

[L CC]
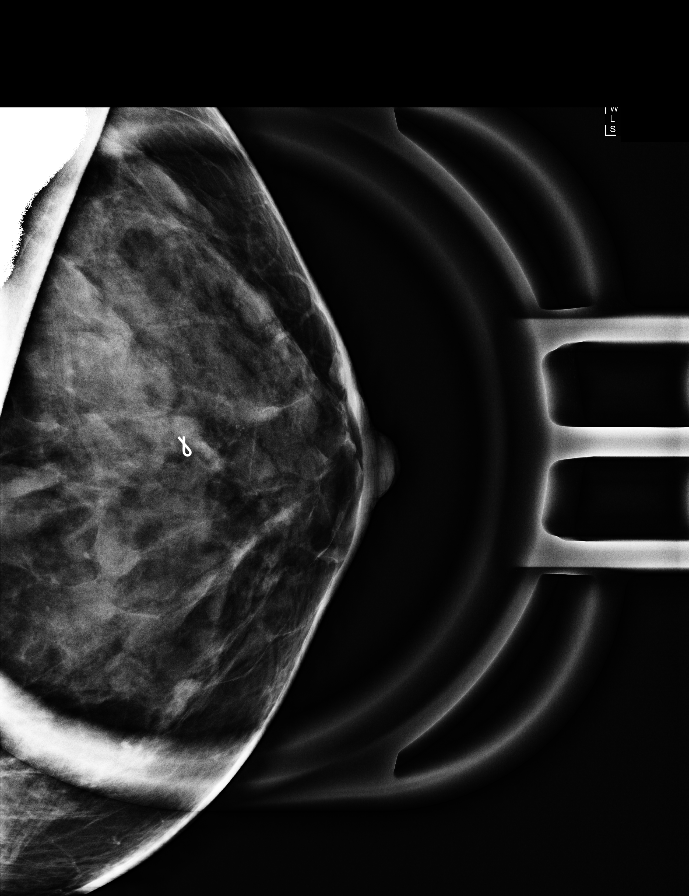

[L CC synth-2D (1 of 2)]
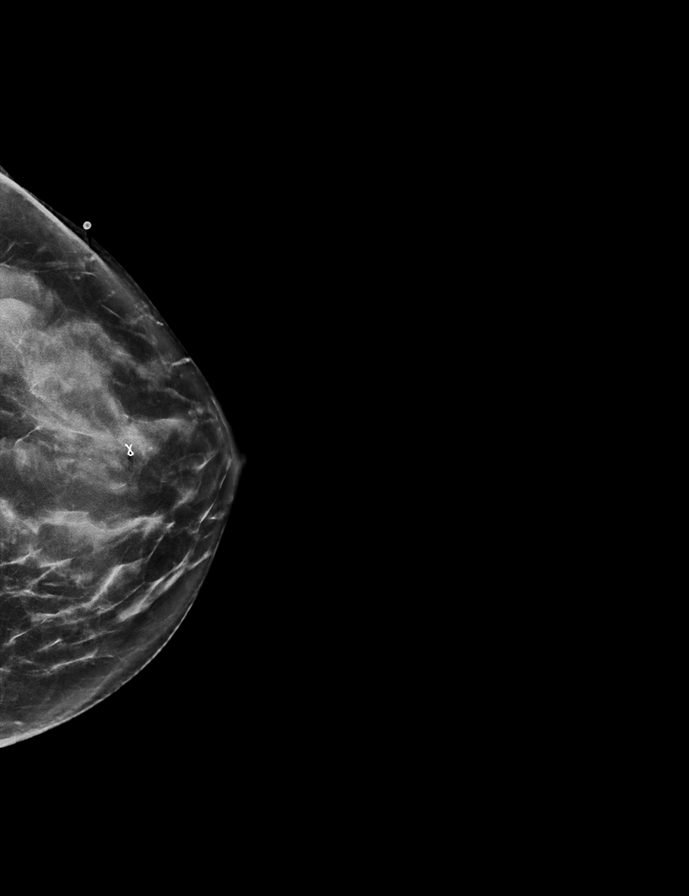

[L CC synth-2D (2 of 2)]
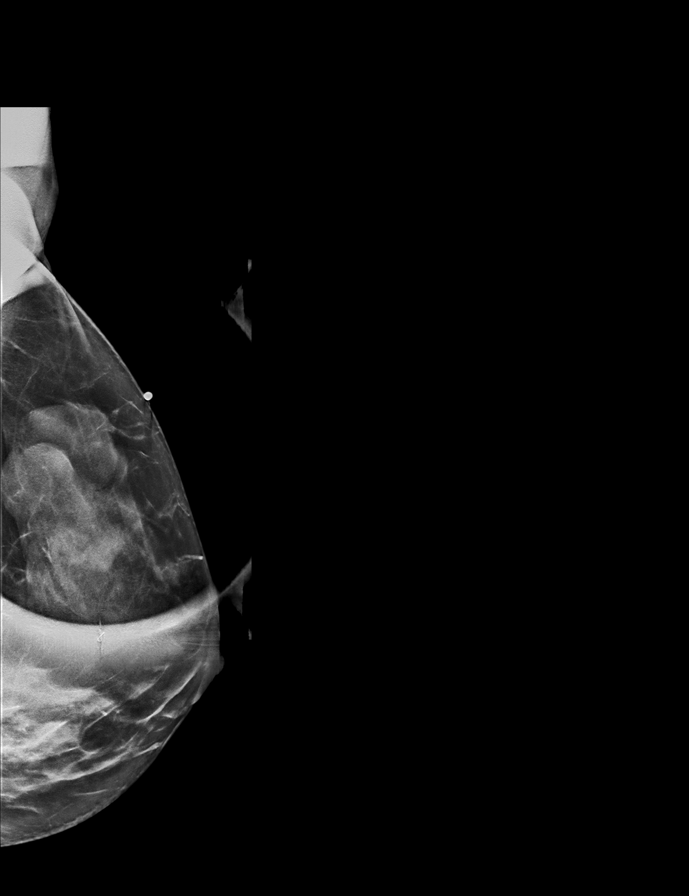

[L MLO synth-2D]
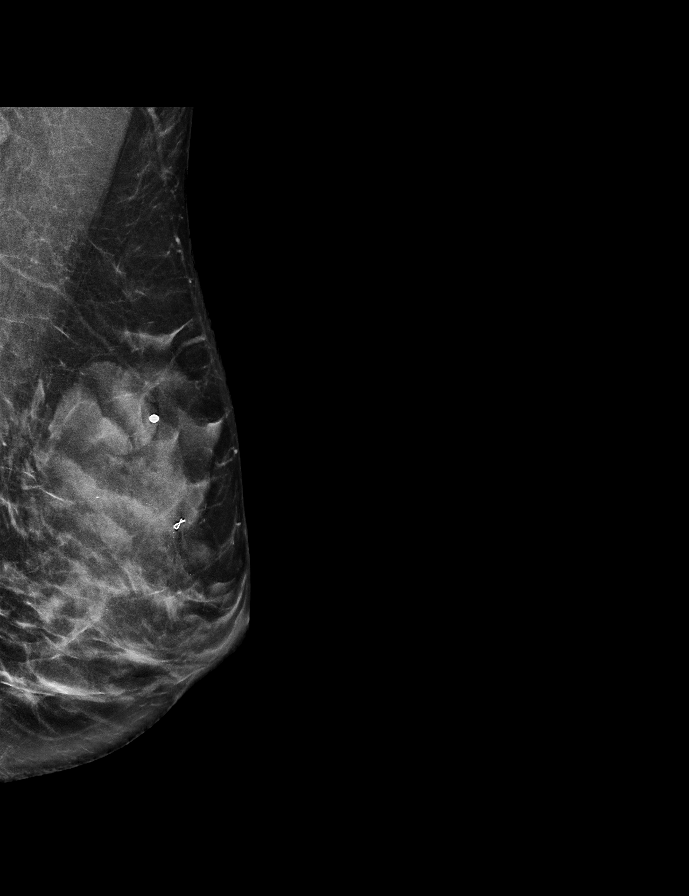

[L CC tomo (1 of 2) · tomo slice 28/55.0]
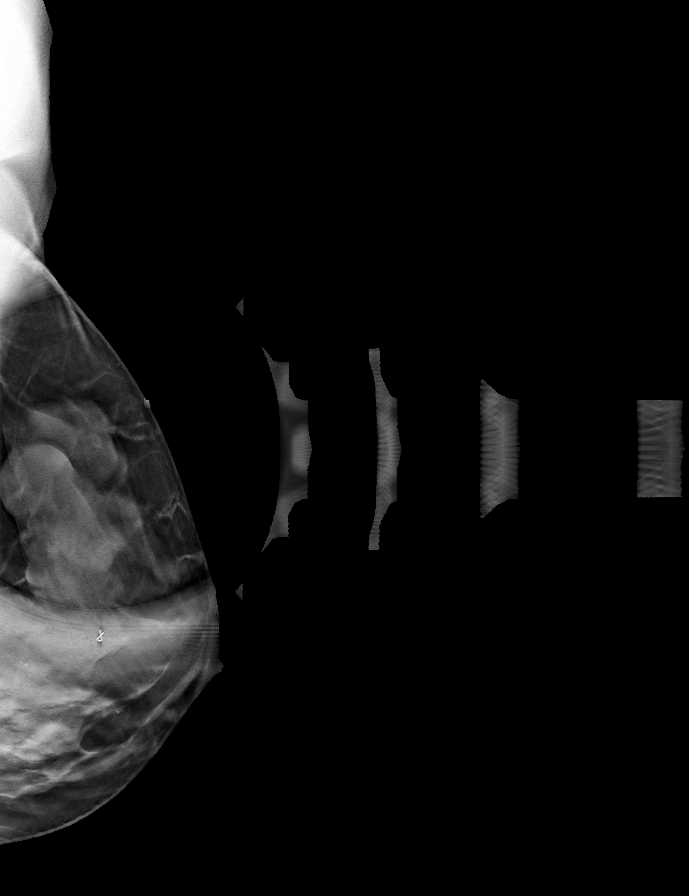

[L CC tomo (2 of 2) · tomo slice 30/59.0]
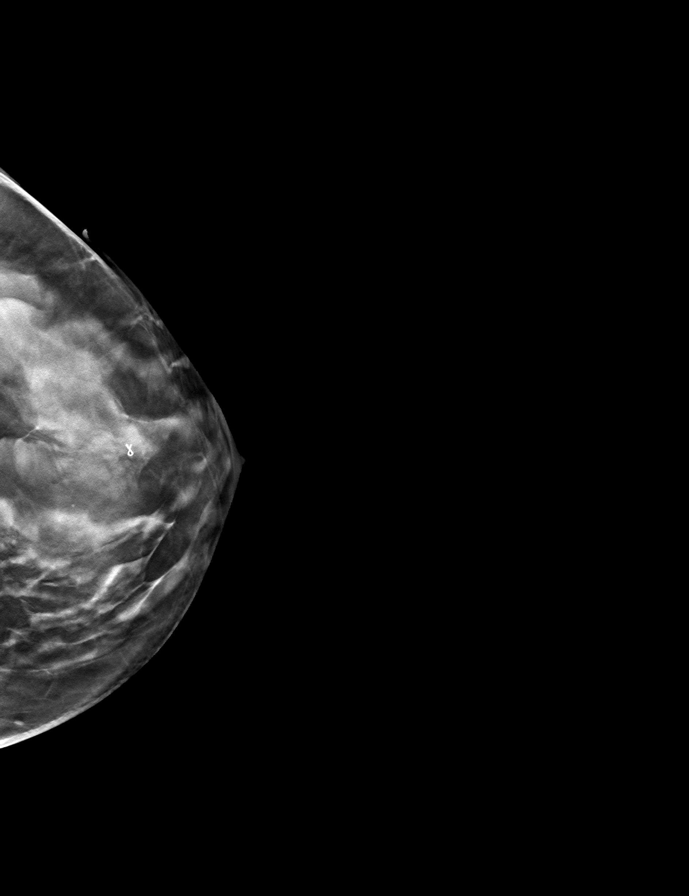

[L MLO tomo · tomo slice 29/56.0]
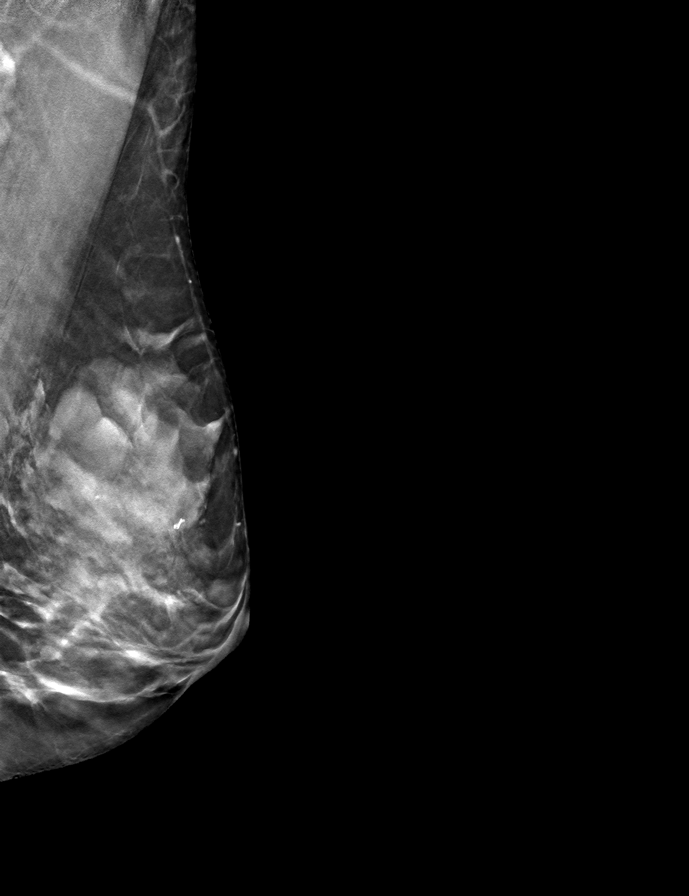

[8 of 20 positions shown; findings below may reference images not displayed]

ACR Breast Density Category c: The breast tissue is heterogeneously
dense, which may obscure small masses.
FINDINGS: Mammographically, there is a lobulated mass, which is isodense to
breast parenchyma in the left breast upper outer quadrant, posterior
depth, which corresponds to the area of palpable concern. Post
biopsy marker is seen more anteriorly in the left breast upper outer
quadrant. A few scattered layering calcifications are seen within
the area of prior biopsy, consistent with benign etiology.

Mammographic images were processed with CAD.

On physical exam, moderately firm palpable round mobile masses in
the left breast upper outer quadrant.

Targeted left breast ultrasound is performed, showing left breast 2
o'clock 3 cm from the nipple benign-appearing cyst measuring 1.8 x
1.9 x 1.1 cm. Immediately adjacent to it, there is a second
benign-appearing cyst measuring 2.3 cm in greatest dimension. These
2 cysts are associated with focal tenderness. Other benign-appearing
cysts are seen in the left breast 2 o'clock 2 cm from the nipple
measuring less than 1.3 cm each. These findings correspond to the
mammographically seen benign-appearing masses.
IMPRESSION: Left breast 2 o'clock benign-appearing cysts.

The patient would like an aspiration of the 2 adjacent cysts at 2
o'clock 3 cm from the nipple, because of focal tenderness.

RECOMMENDATION:
Therapeutic aspiration of 2 adjacent cysts in the left 2 o'clock
breast 3 cm from the nipple.

I have discussed the findings and recommendations with the patient.
Results were also provided in writing at the conclusion of the
visit. If applicable, a reminder letter will be sent to the patient
regarding the next appointment.

BI-RADS CATEGORY  2: Benign.

## 2019-04-27 IMAGING — US ULTRASOUND GUIDED BREAST CYST ASPIRATION
1 series · 2 of 2 positions shown · non-contrast
Comparison: Previous exams.

CLINICAL DATA: Patient with painful cyst left breast 2 o'clock
position.

EXAM:
ULTRASOUND GUIDED LEFT BREAST CYST ASPIRATION

[Series 1: ultrasound guided breast cyst aspiration · 0.06mm/px · 2 of 2 slices shown]
[im 1/2]
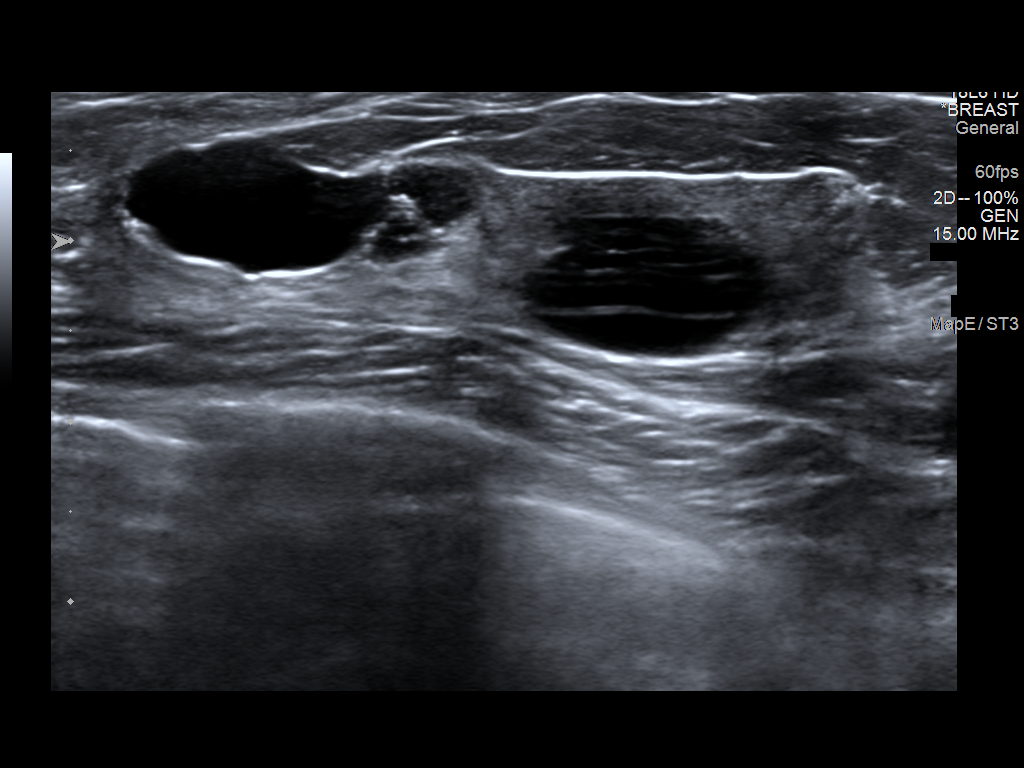
[im 2/2]
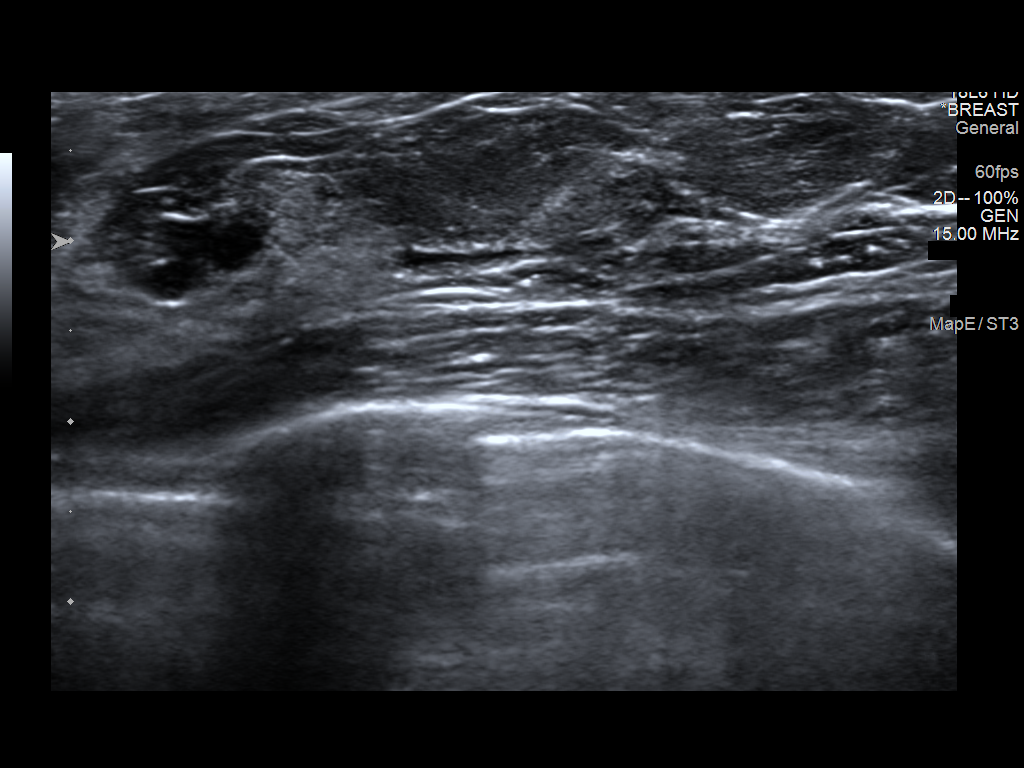

[2 of 2 positions shown; findings below may reference images not displayed]

PROCEDURE:
Using sterile technique, 1% lidocaine, under direct ultrasound
visualization, needle aspiration of left breast cyst 2 o'clock
position was performed. Thin clear fluid was aspirated.
IMPRESSION: Ultrasound-guided aspiration of left breast cyst 2 o'clock position.
No apparent complications.

RECOMMENDATIONS:
Return to annual screening mammography [DATE].

## 2019-04-28 DIAGNOSIS — Z6821 Body mass index (BMI) 21.0-21.9, adult: Secondary | ICD-10-CM | POA: Diagnosis not present

## 2019-04-28 DIAGNOSIS — Z1231 Encounter for screening mammogram for malignant neoplasm of breast: Secondary | ICD-10-CM | POA: Diagnosis not present

## 2019-04-28 DIAGNOSIS — Z01419 Encounter for gynecological examination (general) (routine) without abnormal findings: Secondary | ICD-10-CM | POA: Diagnosis not present

## 2019-04-28 DIAGNOSIS — N925 Other specified irregular menstruation: Secondary | ICD-10-CM | POA: Diagnosis not present

## 2019-05-28 ENCOUNTER — Encounter: Payer: Self-pay | Admitting: Family Medicine

## 2019-08-27 DIAGNOSIS — I10 Essential (primary) hypertension: Secondary | ICD-10-CM | POA: Diagnosis not present

## 2019-08-27 DIAGNOSIS — E785 Hyperlipidemia, unspecified: Secondary | ICD-10-CM | POA: Diagnosis not present

## 2019-09-02 DIAGNOSIS — E875 Hyperkalemia: Secondary | ICD-10-CM | POA: Diagnosis not present

## 2019-09-02 DIAGNOSIS — I10 Essential (primary) hypertension: Secondary | ICD-10-CM | POA: Diagnosis not present

## 2019-12-23 ENCOUNTER — Encounter: Payer: Self-pay | Admitting: *Deleted

## 2019-12-25 ENCOUNTER — Ambulatory Visit: Payer: BC Managed Care – PPO | Admitting: Physician Assistant

## 2019-12-25 ENCOUNTER — Other Ambulatory Visit: Payer: Self-pay

## 2019-12-25 ENCOUNTER — Encounter: Payer: Self-pay | Admitting: Physician Assistant

## 2019-12-25 DIAGNOSIS — L578 Other skin changes due to chronic exposure to nonionizing radiation: Secondary | ICD-10-CM

## 2019-12-25 DIAGNOSIS — Z1283 Encounter for screening for malignant neoplasm of skin: Secondary | ICD-10-CM

## 2019-12-25 DIAGNOSIS — D229 Melanocytic nevi, unspecified: Secondary | ICD-10-CM | POA: Diagnosis not present

## 2019-12-25 DIAGNOSIS — L814 Other melanin hyperpigmentation: Secondary | ICD-10-CM

## 2019-12-25 DIAGNOSIS — L821 Other seborrheic keratosis: Secondary | ICD-10-CM

## 2019-12-25 DIAGNOSIS — Z86018 Personal history of other benign neoplasm: Secondary | ICD-10-CM

## 2019-12-25 DIAGNOSIS — D1801 Hemangioma of skin and subcutaneous tissue: Secondary | ICD-10-CM

## 2019-12-25 NOTE — Addendum Note (Signed)
Addended by: Robyne Askew R on: 12/25/2019 02:44 PM   Modules accepted: Level of Service

## 2019-12-25 NOTE — Progress Notes (Signed)
   Follow-Up Visit   Subjective  Sherry Skinner is a 49 y.o. female who presents for the following: Annual Exam (Concerns face, mole on right arm, both legs. New spots. ).   The following portions of the chart were reviewed this encounter and updated as appropriate: Tobacco  Allergies  Meds  Problems  Med Hx  Surg Hx  Fam Hx      Objective  Well appearing patient in no apparent distress; mood and affect are within normal limits.  A full examination was performed including scalp, head, eyes, ears, nose, lips, neck, chest, axillae, abdomen, back, buttocks, bilateral upper extremities, bilateral lower extremities, hands, feet, fingers, toes, fingernails, and toenails. All findings within normal limits unless otherwise noted below.  Objective  Left Knee - Anterior, Right Knee - Anterior: Scars clear  Objective  Head to toe: No DN or signs of NMSC  Assessment & Plan     History of dysplastic nevus (2) Left Knee - Anterior; Right Knee - Anterior  Yearly skin exams  Screening exam for skin cancer Head to toe  Yearly skin exams No atypical nevi noted at the time of the visit. Lentigines - Scattered tan macules - Discussed due to sun exposure - Benign, observe - Call for any changes  Seborrheic Keratoses - Stuck-on, waxy, tan-brown papules and plaques  - Discussed benign etiology and prognosis. - Observe - Call for any changes  Melanocytic Nevi - Tan-brown and/or pink-flesh-colored symmetric macules and papules - Benign appearing on exam today - Observation - Call clinic for new or changing moles - Recommend daily use of broad spectrum spf 30+ sunscreen to sun-exposed areas.   Hemangiomas - Red papules - Discussed benign nature - Observe - Call for any changes  Actinic Damage - diffuse scaly erythematous macules with underlying dyspigmentation - Recommend daily broad spectrum sunscreen SPF 30+ to sun-exposed areas, reapply every 2 hours as needed.  -  Call for new or changing lesions.  Skin cancer screening performed today. No atypical nevi noted at the time of the visit.

## 2020-01-16 DIAGNOSIS — E785 Hyperlipidemia, unspecified: Secondary | ICD-10-CM | POA: Diagnosis not present

## 2020-01-16 DIAGNOSIS — F419 Anxiety disorder, unspecified: Secondary | ICD-10-CM | POA: Diagnosis not present

## 2020-01-16 DIAGNOSIS — I10 Essential (primary) hypertension: Secondary | ICD-10-CM | POA: Diagnosis not present

## 2020-03-29 DIAGNOSIS — S8391XA Sprain of unspecified site of right knee, initial encounter: Secondary | ICD-10-CM | POA: Diagnosis not present

## 2020-05-21 DIAGNOSIS — E785 Hyperlipidemia, unspecified: Secondary | ICD-10-CM | POA: Diagnosis not present

## 2020-05-21 DIAGNOSIS — M1990 Unspecified osteoarthritis, unspecified site: Secondary | ICD-10-CM | POA: Diagnosis not present

## 2020-05-21 DIAGNOSIS — I10 Essential (primary) hypertension: Secondary | ICD-10-CM | POA: Diagnosis not present

## 2021-06-14 DIAGNOSIS — R059 Cough, unspecified: Secondary | ICD-10-CM | POA: Diagnosis not present

## 2021-06-14 DIAGNOSIS — F17200 Nicotine dependence, unspecified, uncomplicated: Secondary | ICD-10-CM | POA: Diagnosis not present

## 2021-06-14 DIAGNOSIS — J0100 Acute maxillary sinusitis, unspecified: Secondary | ICD-10-CM | POA: Diagnosis not present
